# Patient Record
Sex: Female | Born: 2000 | Race: White | Hispanic: Yes | Marital: Single | Smoking: Never smoker
Health system: Southern US, Community
[De-identification: ages and names within clinical notes are randomized; demographics above are authoritative.]

---

## 2019-10-17 ENCOUNTER — Other Ambulatory Visit: Payer: Self-pay

## 2019-10-17 ENCOUNTER — Encounter (HOSPITAL_COMMUNITY): Payer: Self-pay

## 2019-10-17 ENCOUNTER — Inpatient Hospital Stay (HOSPITAL_COMMUNITY)
Admission: AD | Admit: 2019-10-17 | Discharge: 2019-10-18 | Disposition: A | Discharge status: Home / Self Care | Payer: Self-pay | Attending: Obstetrics & Gynecology | Admitting: Obstetrics & Gynecology

## 2019-10-17 DIAGNOSIS — O26899 Other specified pregnancy related conditions, unspecified trimester: Secondary | ICD-10-CM

## 2019-10-17 DIAGNOSIS — K2970 Gastritis, unspecified, without bleeding: Secondary | ICD-10-CM

## 2019-10-17 DIAGNOSIS — R102 Pelvic and perineal pain: Secondary | ICD-10-CM | POA: Insufficient documentation

## 2019-10-17 DIAGNOSIS — O99111 Other diseases of the blood and blood-forming organs and certain disorders involving the immune mechanism complicating pregnancy, first trimester: Secondary | ICD-10-CM | POA: Insufficient documentation

## 2019-10-17 DIAGNOSIS — R1012 Left upper quadrant pain: Secondary | ICD-10-CM | POA: Insufficient documentation

## 2019-10-17 DIAGNOSIS — O211 Hyperemesis gravidarum with metabolic disturbance: Secondary | ICD-10-CM | POA: Insufficient documentation

## 2019-10-17 DIAGNOSIS — R112 Nausea with vomiting, unspecified: Secondary | ICD-10-CM

## 2019-10-17 DIAGNOSIS — D72829 Elevated white blood cell count, unspecified: Secondary | ICD-10-CM | POA: Insufficient documentation

## 2019-10-17 DIAGNOSIS — Z3A01 Less than 8 weeks gestation of pregnancy: Secondary | ICD-10-CM | POA: Insufficient documentation

## 2019-10-17 DIAGNOSIS — E876 Hypokalemia: Secondary | ICD-10-CM

## 2019-10-17 DIAGNOSIS — O26891 Other specified pregnancy related conditions, first trimester: Secondary | ICD-10-CM | POA: Insufficient documentation

## 2019-10-17 DIAGNOSIS — O3680X0 Pregnancy with inconclusive fetal viability, not applicable or unspecified: Secondary | ICD-10-CM

## 2019-10-17 LAB — CBC
HCT: 46.5 % — ABNORMAL HIGH (ref 36.0–46.0)
Hemoglobin: 15.6 g/dL — ABNORMAL HIGH (ref 12.0–15.0)
MCH: 26.7 pg (ref 26.0–34.0)
MCHC: 33.5 g/dL (ref 30.0–36.0)
MCV: 79.5 fL — ABNORMAL LOW (ref 80.0–100.0)
Platelets: 369 10*3/uL (ref 150–400)
RBC: 5.85 MIL/uL — ABNORMAL HIGH (ref 3.87–5.11)
RDW: 14.3 % (ref 11.5–15.5)
WBC: 16.5 10*3/uL — ABNORMAL HIGH (ref 4.0–10.5)
nRBC: 0 % (ref 0.0–0.2)

## 2019-10-17 LAB — I-STAT BETA HCG BLOOD, ED (MC, WL, AP ONLY): I-stat hCG, quantitative: >2000 m[IU]/mL — ABNORMAL HIGH (ref <5)

## 2019-10-17 LAB — URINALYSIS, ROUTINE W REFLEX MICROSCOPIC
Bilirubin Urine: NEGATIVE
Glucose, UA: NEGATIVE mg/dL
Ketones, ur: 80 mg/dL — AB
Nitrite: NEGATIVE
Protein, ur: NEGATIVE mg/dL
Specific Gravity, Urine: 1.018 (ref 1.005–1.030)
pH: 5 (ref 5.0–8.0)

## 2019-10-17 LAB — COMPREHENSIVE METABOLIC PANEL
ALT: 11 U/L (ref 0–44)
AST: 15 U/L (ref 15–41)
Albumin: 4.4 g/dL (ref 3.5–5.0)
Alkaline Phosphatase: 89 U/L (ref 38–126)
Anion gap: 17 — ABNORMAL HIGH (ref 5–15)
BUN: 19 mg/dL (ref 6–20)
CO2: 28 mmol/L (ref 22–32)
Calcium: 9.2 mg/dL (ref 8.9–10.3)
Chloride: 89 mmol/L — ABNORMAL LOW (ref 98–111)
Creatinine, Ser: 0.76 mg/dL (ref 0.44–1.00)
GFR calc Af Amer: >60 mL/min (ref >60)
GFR calc non Af Amer: >60 mL/min (ref >60)
Glucose, Bld: 96 mg/dL (ref 70–99)
Potassium: 2.8 mmol/L — ABNORMAL LOW (ref 3.5–5.1)
Sodium: 134 mmol/L — ABNORMAL LOW (ref 135–145)
Total Bilirubin: 1.3 mg/dL — ABNORMAL HIGH (ref 0.3–1.2)
Total Protein: 8.2 g/dL — ABNORMAL HIGH (ref 6.5–8.1)

## 2019-10-17 LAB — WET PREP, GENITAL
Sperm: NONE SEEN
Trich, Wet Prep: NONE SEEN
Yeast Wet Prep HPF POC: NONE SEEN

## 2019-10-17 LAB — LIPASE, BLOOD: Lipase: 22 U/L (ref 11–51)

## 2019-10-17 LAB — ABO/RH: ABO/RH(D): O POS

## 2019-10-17 MED ORDER — SODIUM CHLORIDE 0.9% FLUSH
3.0000 mL | Freq: Once | INTRAVENOUS | Status: AC
  Administered 2019-10-17: 3 mL via INTRAVENOUS

## 2019-10-17 MED ORDER — ONDANSETRON 4 MG PO TBDP
4.0000 mg | ORAL_TABLET | Freq: Once | ORAL | Status: AC | PRN
  Administered 2019-10-17: 4 mg via ORAL
  Filled 2019-10-17: qty 1

## 2019-10-17 MED ORDER — SODIUM CHLORIDE 0.9 % IV SOLN
Freq: Once | INTRAVENOUS | Status: AC
  Administered 2019-10-18: via INTRAVENOUS

## 2019-10-17 MED ORDER — POTASSIUM CHLORIDE 10 MEQ/100ML IV SOLN
10.0000 meq | INTRAVENOUS | Status: DC
  Administered 2019-10-18: 10 meq via INTRAVENOUS
  Filled 2019-10-17 (×3): qty 100

## 2019-10-17 NOTE — ED Triage Notes (Signed)
Pt reports generalized abd pain and emesis since Saturday. Pt vomiting in triage.

## 2019-10-17 NOTE — MAU Note (Signed)
Pt reports to MAU from the ED for abd pain and vomiting, also just found out she is pregnant at the ED.

## 2019-10-17 NOTE — MAU Provider Note (Signed)
Chief Complaint: Abdominal Pain and Emesis   First Provider Initiated Contact with Patient 10/17/19 2302        SUBJECTIVE HPI: Lori Daugherty is a 18 y.o. G1P0 at [redacted]w[redacted]d by LMP who presents to maternity admissions reporting vomiting since Saturday (4 days).  Also has Left upper quadrant pain since then.  Has had this happen before, a few months ago in [place]. States had a full work up including CT scan and "they never found anything".  States had it happen one other time with no findings.  Has not tried anything for it.  Did not know she was pregnant.  Was not using contraception.. She denies vaginal bleeding, vaginal itching/burning, urinary symptoms, h/a, dizziness, or fever/chills.    Abdominal Pain This is a new problem. The current episode started in the past 7 days. The problem occurs constantly. The problem has been unchanged. The pain is located in the LUQ. The pain is moderate. The quality of the pain is dull. The abdominal pain does not radiate. Associated symptoms include nausea and vomiting. Pertinent negatives include no constipation, diarrhea, dysuria, fever, frequency, headaches or myalgias. Nothing aggravates the pain. The pain is relieved by nothing. She has tried nothing for the symptoms. There is no history of abdominal surgery.  Emesis  This is a new problem. The current episode started in the past 7 days. The problem has been unchanged. There has been no fever. Associated symptoms include abdominal pain. Pertinent negatives include no chest pain, chills, coughing, diarrhea, dizziness, fever, headaches or myalgias. She has tried nothing for the symptoms.   RN Note: Pt reports to MAU from the ED for abd pain and vomiting, also just found out she is pregnant at the ED.  History reviewed. No pertinent past medical history. History reviewed. No pertinent surgical history. Social History   Socioeconomic History  . Marital status: Single    Spouse name: Not on file  . Number  of children: Not on file  . Years of education: Not on file  . Highest education level: Not on file  Occupational History  . Not on file  Social Needs  . Financial resource strain: Not on file  . Food insecurity    Worry: Not on file    Inability: Not on file  . Transportation needs    Medical: Not on file    Non-medical: Not on file  Tobacco Use  . Smoking status: Never Smoker  Substance and Sexual Activity  . Alcohol use: Never    Frequency: Never  . Drug use: Never  . Sexual activity: Not on file  Lifestyle  . Physical activity    Days per week: Not on file    Minutes per session: Not on file  . Stress: Not on file  Relationships  . Social Herbalist on phone: Not on file    Gets together: Not on file    Attends religious service: Not on file    Active member of club or organization: Not on file    Attends meetings of clubs or organizations: Not on file    Relationship status: Not on file  . Intimate partner violence    Fear of current or ex partner: Not on file    Emotionally abused: Not on file    Physically abused: Not on file    Forced sexual activity: Not on file  Other Topics Concern  . Not on file  Social History Narrative  . Not on file  No current facility-administered medications on file prior to encounter.    No current outpatient medications on file prior to encounter.   No Known Allergies  I have reviewed patient's Past Medical Hx, Surgical Hx, Family Hx, Social Hx, medications and allergies.   ROS:  Review of Systems  Constitutional: Negative for chills and fever.  Respiratory: Negative for cough.   Cardiovascular: Negative for chest pain.  Gastrointestinal: Positive for abdominal pain, nausea and vomiting. Negative for constipation and diarrhea.  Genitourinary: Negative for dysuria and frequency.  Musculoskeletal: Negative for myalgias.  Neurological: Negative for dizziness and headaches.   Review of Systems  Other systems  negative   Physical Exam  Physical Exam Patient Vitals for the past 24 hrs:  BP Temp Temp src Pulse Resp SpO2 Height Weight  10/17/19 2222 119/83 - - 80 - - - -  10/17/19 2012 127/74 98 F (36.7 C) Oral 73 18 100 %  (1.549 m) 59 kg   Constitutional: Well-developed, well-nourished female in no acute distress.  Cardiovascular: normal rate Respiratory: normal effort GI: Abd soft, non-tender. States LUQ is tender to palpation but does not react with exam.  Pos BS x 4 MS: Extremities nontender, no edema, normal ROM Neurologic: Alert and oriented x 4.  GU: Neg CVAT bilaterally  PELVIC EXAM: Cervix pink, visually closed, without lesion, scant white creamy discharge, vaginal walls and external genitalia normal Bimanual exam: Cervix 0/long/high, firm, anterior, neg CMT, uterus nontender, nonenlarged, adnexa without tenderness, enlargement, or mass   LAB RESULTS Results for orders placed or performed during the hospital encounter of 10/17/19 (from the past 24 hour(s))  Urinalysis, Routine w reflex microscopic     Status: Abnormal   Collection Time: 10/17/19  8:16 PM  Result Value Ref Range   Color, Urine YELLOW YELLOW   APPearance HAZY (A) CLEAR   Specific Gravity, Urine 1.018 1.005 - 1.030   pH 5.0 5.0 - 8.0   Glucose, UA NEGATIVE NEGATIVE mg/dL   Hgb urine dipstick SMALL (A) NEGATIVE   Bilirubin Urine NEGATIVE NEGATIVE   Ketones, ur 80 (A) NEGATIVE mg/dL   Protein, ur NEGATIVE NEGATIVE mg/dL   Nitrite NEGATIVE NEGATIVE   Leukocytes,Ua MODERATE (A) NEGATIVE   RBC / HPF 0-5 0 - 5 RBC/hpf   WBC, UA 21-50 0 - 5 WBC/hpf   Bacteria, UA FEW (A) NONE SEEN   Squamous Epithelial / LPF 0-5 0 - 5   Mucus PRESENT   Lipase, blood     Status: None   Collection Time: 10/17/19  8:23 PM  Result Value Ref Range   Lipase 22 11 - 51 U/L  Comprehensive metabolic panel     Status: Abnormal   Collection Time: 10/17/19  8:23 PM  Result Value Ref Range   Sodium 134 (L) 135 - 145 mmol/L    Potassium 2.8 (L) 3.5 - 5.1 mmol/L   Chloride 89 (L) 98 - 111 mmol/L   CO2 28 22 - 32 mmol/L   Glucose, Bld 96 70 - 99 mg/dL   BUN 19 6 - 20 mg/dL   Creatinine, Ser 1.61 0.44 - 1.00 mg/dL   Calcium 9.2 8.9 - 09.6 mg/dL   Total Protein 8.2 (H) 6.5 - 8.1 g/dL   Albumin 4.4 3.5 - 5.0 g/dL   AST 15 15 - 41 U/L   ALT 11 0 - 44 U/L   Alkaline Phosphatase 89 38 - 126 U/L   Total Bilirubin 1.3 (H) 0.3 - 1.2 mg/dL   GFR  calc non Af Amer >60 >60 mL/min   GFR calc Af Amer >60 >60 mL/min   Anion gap 17 (H) 5 - 15  CBC     Status: Abnormal   Collection Time: 10/17/19  8:23 PM  Result Value Ref Range   WBC 16.5 (H) 4.0 - 10.5 K/uL   RBC 5.85 (H) 3.87 - 5.11 MIL/uL   Hemoglobin 15.6 (H) 12.0 - 15.0 g/dL   HCT 36.1 (H) 44.3 - 15.4 %   MCV 79.5 (L) 80.0 - 100.0 fL   MCH 26.7 26.0 - 34.0 pg   MCHC 33.5 30.0 - 36.0 g/dL   RDW 00.8 67.6 - 19.5 %   Platelets 369 150 - 400 K/uL   nRBC 0.0 0.0 - 0.2 %  I-Stat beta hCG blood, ED     Status: Abnormal   Collection Time: 10/17/19  8:57 PM  Result Value Ref Range   I-stat hCG, quantitative >2,000.0 (H) <5 mIU/mL   Comment 3          Wet prep, genital     Status: Abnormal   Collection Time: 10/17/19 11:08 PM  Result Value Ref Range   Yeast Wet Prep HPF POC NONE SEEN NONE SEEN   Trich, Wet Prep NONE SEEN NONE SEEN   Clue Cells Wet Prep HPF POC PRESENT (A) NONE SEEN   WBC, Wet Prep HPF POC MANY (A) NONE SEEN   Sperm NONE SEEN   hCG quantitative     Status: Abnormal   Collection Time: 10/17/19 11:17 PM  Result Value Ref Range   hCG, Beta Chain, Quant, S 6,026 (H) <5 mIU/mL  ABO/Rh     Status: None   Collection Time: 10/17/19 11:17 PM  Result Value Ref Range   ABO/RH(D) O POS    No rh immune globuloin      NOT A RH IMMUNE GLOBULIN CANDIDATE, PT RH POSITIVE Performed at [facility], [address], [place], [place] [address]   HIV Antibody (routine testing w rflx)     Status: None   Collection Time: 10/17/19 11:17 PM  Result Value Ref  Range   HIV Screen 4th Generation wRfx NON REACTIVE NON REACTIVE    IMAGING US Abdomen Complete  Result Date: 10/18/2019 CLINICAL DATA:  Pelvic pain and left upper quadrant pain EXAM: ABDOMEN ULTRASOUND COMPLETE COMPARISON:  None. FINDINGS: Gallbladder: No gallstones or wall thickening visualized. No sonographic Murphy sign noted by sonographer. Common bile duct: Diameter: 3.6 mm Liver: No focal lesion identified. Within normal limits in parenchymal echogenicity. Portal vein is patent on color Doppler imaging with normal direction of blood flow towards the liver. IVC: No abnormality visualized. Pancreas: Visualized portion unremarkable. Spleen: Size and appearance within normal limits. Right Kidney: Length: 9.7. Echogenicity within normal limits. No mass or hydronephrosis visualized. Left Kidney: Length: 10.4. Echogenicity within normal limits. No mass or hydronephrosis visualized. Abdominal aorta: No aneurysm visualized. Other findings: None. IMPRESSION: Normal abdominal ultrasound Electronically Signed   By: Jonna Clark M.D.   On: 10/18/2019 01:08   US Ob Less Than 14 Weeks With Ob Transvaginal  Result Date: 10/18/2019 CLINICAL DATA:  Positive pregnancy test, pelvic pain EXAM: OBSTETRIC <14 WK [place] AND TRANSVAGINAL OB US TECHNIQUE: Both transabdominal and transvaginal ultrasound examinations were performed for complete evaluation of the gestation as well as the maternal uterus, adnexal regions, and pelvic cul-de-sac. Transvaginal technique was performed to assess early pregnancy. COMPARISON:  None. FINDINGS: Intrauterine gestational sac: Single Yolk sac:  Visualized. Embryo:  Not Visualized. Cardiac Activity: Not Visualized. MSD: 6.16 mm   5 w   2 d Subchorionic hemorrhage:  None visualized. Maternal uterus/adnexae: Normal appearing ovaries. IMPRESSION: Probable early intrauterine gestational sac and yolk sac, but no fetal pole, or cardiac activity yet visualized. Recommend follow-up quantitative B-HCG  levels and follow-up US in 14 days to assess viability. This recommendation follows SRU consensus guidelines: Diagnostic Criteria for Nonviable Pregnancy Early in the First Trimester. Malva Limes Engl J Med 2013; 161:0960-45; 369:1443-51. Electronically Signed   By: Jonna ClarkBindu  Avutu M.D.   On: 10/18/2019 01:10     MAU Management/MDM: Ordered usual first trimester r/o ectopic labs.   Pelvic exam and cultures done Will check baseline Ultrasound to rule out ectopic.  >> US rules out ectopic pregnancy, GS with YS seen  This bleeding/pain can represent a normal pregnancy with bleeding, spontaneous abortion or even an ectopic which can be life-threatening.  The process as listed above helps to determine which of these is present.   Abdominal US is negative.  IV potassium runs ordered.  Tried to give PO potassium but she vomited it, so continued with IV GI cocktail given which was tolerated.   This did help the LUQ pain Zofran was given in main ED.  Has not vomited while here except with K-Dur.  States is nauseated Since her WBC count is so high and US was negative, will treat presumptively with Rocephin for Pyelo (leukocytes and Hgb) Urine sent for culture Once Potassium supps are infused will try to discharge home Suspect Nausea / vomiting may be pregnancy related  LUQ pain likely gastric  ASSESSMENT Pregnancy at 5520w5d by LMP, 49105w2d by US (GS and YS only) LUQ pain, likely gastritis Leukocytosis and leukocytes in urine, possible UTI Nausea and vomiting Pelvic pain, ectopic pregnancy ruled out Hypokalemia, partially replaced.  PLAN Discharge home Rx Phenergan for nausea  Rx Protonix for acid reflux/gastritis Urine to culture Pt stable at time of discharge. Encouraged to return here or to other Urgent Care/ED if she develops worsening of symptoms, increase in pain, fever, or other concerning symptoms.    Wynelle BourgeoisMarie Deyvi Bonanno CNM, MSN Certified Nurse-Midwife 10/17/2019  11:02 PM

## 2019-10-18 ENCOUNTER — Inpatient Hospital Stay (HOSPITAL_COMMUNITY): Payer: Self-pay

## 2019-10-18 LAB — HEPATITIS A ANTIBODY, TOTAL: hep A Total Ab: REACTIVE — AB

## 2019-10-18 LAB — HCG, QUANTITATIVE, PREGNANCY: hCG, Beta Chain, Quant, S: 6026 m[IU]/mL — ABNORMAL HIGH (ref <5)

## 2019-10-18 LAB — HIV ANTIBODY (ROUTINE TESTING W REFLEX): HIV Screen 4th Generation wRfx: NONREACTIVE

## 2019-10-18 MED ORDER — PANTOPRAZOLE SODIUM 20 MG PO TBEC: 20.0000 mg | DELAYED_RELEASE_TABLET | Freq: Every day | ORAL | 0 refills | Status: AC

## 2019-10-18 MED ORDER — PROMETHAZINE HCL 25 MG/ML IJ SOLN
12.5000 mg | Freq: Once | INTRAMUSCULAR | Status: AC
  Administered 2019-10-18: 12.5 mg via INTRAVENOUS
  Filled 2019-10-18: qty 1

## 2019-10-18 MED ORDER — POTASSIUM CHLORIDE CRYS ER 20 MEQ PO TBCR: 40.0000 meq | EXTENDED_RELEASE_TABLET | Freq: Once | ORAL | Status: DC

## 2019-10-18 MED ORDER — POTASSIUM CHLORIDE 10 MEQ/100ML IV SOLN
10.0000 meq | Freq: Once | INTRAVENOUS | Status: AC
  Administered 2019-10-18: 10 meq via INTRAVENOUS
  Filled 2019-10-18 (×2): qty 100

## 2019-10-18 MED ORDER — PROMETHAZINE HCL 25 MG PO TABS: 25.0000 mg | ORAL_TABLET | Freq: Four times a day (QID) | ORAL | 2 refills | Status: AC | PRN

## 2019-10-18 MED ORDER — ALUM & MAG HYDROXIDE-SIMETH 200-200-20 MG/5ML PO SUSP
30.0000 mL | Freq: Once | ORAL | Status: AC
  Administered 2019-10-18: 30 mL via ORAL
  Filled 2019-10-18: qty 30

## 2019-10-18 MED ORDER — POTASSIUM CHLORIDE 10 MEQ/100ML IV SOLN
10.0000 meq | Freq: Once | INTRAVENOUS | Status: AC
  Filled 2019-10-18: qty 100

## 2019-10-18 MED ORDER — LACTATED RINGERS IV SOLN: Freq: Once | INTRAVENOUS | Status: DC

## 2019-10-18 MED ORDER — POTASSIUM CHLORIDE CRYS ER 20 MEQ PO TBCR
40.0000 meq | EXTENDED_RELEASE_TABLET | Freq: Once | ORAL | Status: AC
  Administered 2019-10-18: 40 meq via ORAL
  Filled 2019-10-18: qty 2

## 2019-10-18 MED ORDER — SODIUM CHLORIDE 0.9 % IV SOLN
INTRAVENOUS | Status: DC
  Administered 2019-10-18: 04:00:00 via INTRAVENOUS

## 2019-10-18 MED ORDER — LIDOCAINE VISCOUS HCL 2 % MT SOLN
15.0000 mL | Freq: Once | OROMUCOSAL | Status: AC
  Administered 2019-10-18: 15 mL via ORAL
  Filled 2019-10-18: qty 15

## 2019-10-18 MED ORDER — SODIUM CHLORIDE 0.9 % IV SOLN
1.0000 g | Freq: Once | INTRAVENOUS | Status: AC
  Administered 2019-10-18: 1 g via INTRAVENOUS
  Filled 2019-10-18: qty 1

## 2019-10-18 NOTE — MAU Note (Signed)
Pt has not thrown up since she had the GI medication. She also reports that the burning sensation she had in her upper left epigastric area has improved.Marilynne Drivers, RN

## 2019-10-18 NOTE — Discharge Instructions (Signed)
First Trimester of Pregnancy °The first trimester of pregnancy is from week 1 until the end of week 13 (months 1 through 3). A week after a sperm fertilizes an egg, the egg will implant on the wall of the uterus. This embryo will begin to develop into a baby. Genes from you and your partner will form the baby. The female genes will determine whether the baby will be a boy or a girl. At 6-8 weeks, the eyes and face will be formed, and the heartbeat can be seen on ultrasound. At the end of 12 weeks, all the baby's organs will be formed. °Now that you are pregnant, you will want to do everything you can to have a healthy baby. Two of the most important things are to get good prenatal care and to follow your health care provider's instructions. Prenatal care is all the medical care you receive before the baby's birth. This care will help prevent, find, and treat any problems during the pregnancy and childbirth. °Body changes during your first trimester °Your body goes through many changes during pregnancy. The changes vary from woman to woman. °· You may gain or lose a couple of pounds at first. °· You may feel sick to your stomach (nauseous) and you may throw up (vomit). If the vomiting is uncontrollable, call your health care provider. °· You may tire easily. °· You may develop headaches that can be relieved by medicines. All medicines should be approved by your health care provider. °· You may urinate more often. Painful urination may mean you have a bladder infection. °· You may develop heartburn as a result of your pregnancy. °· You may develop constipation because certain hormones are causing the muscles that push stool through your intestines to slow down. °· You may develop hemorrhoids or swollen veins (varicose veins). °· Your breasts may begin to grow larger and become tender. Your nipples may stick out more, and the tissue that surrounds them (areola) may become darker. °· Your gums may bleed and may be  sensitive to brushing and flossing. °· Dark spots or blotches (chloasma, mask of pregnancy) may develop on your face. This will likely fade after the baby is born. °· Your menstrual periods will stop. °· You may have a loss of appetite. °· You may develop cravings for certain kinds of food. °· You may have changes in your emotions from day to day, such as being excited to be pregnant or being concerned that something may go wrong with the pregnancy and baby. °· You may have more vivid and strange dreams. °· You may have changes in your hair. These can include thickening of your hair, rapid growth, and changes in texture. Some women also have hair loss during or after pregnancy, or hair that feels dry or thin. Your hair will most likely return to normal after your baby is born. °What to expect at prenatal visits °During a routine prenatal visit: °· You will be weighed to make sure you and the baby are growing normally. °· Your blood pressure will be taken. °· Your abdomen will be measured to track your baby's growth. °· The fetal heartbeat will be listened to between weeks 10 and 14 of your pregnancy. °· Test results from any previous visits will be discussed. °Your health care provider may ask you: °· How you are feeling. °· If you are feeling the baby move. °· If you have had any abnormal symptoms, such as leaking fluid, bleeding, severe headaches, or abdominal   cramping. °· If you are using any tobacco products, including cigarettes, chewing tobacco, and electronic cigarettes. °· If you have any questions. °Other tests that may be performed during your first trimester include: °· Blood tests to find your blood type and to check for the presence of any previous infections. The tests will also be used to check for low iron levels (anemia) and protein on red blood cells (Rh antibodies). Depending on your risk factors, or if you previously had diabetes during pregnancy, you may have tests to check for high blood sugar  that affects pregnant women (gestational diabetes). °· Urine tests to check for infections, diabetes, or protein in the urine. °· An ultrasound to confirm the proper growth and development of the baby. °· Fetal screens for spinal cord problems (spina bifida) and Down syndrome. °· HIV (human immunodeficiency virus) testing. Routine prenatal testing includes screening for HIV, unless you choose not to have this test. °· You may need other tests to make sure you and the baby are doing well. °Follow these instructions at home: °Medicines °· Follow your health care provider's instructions regarding medicine use. Specific medicines may be either safe or unsafe to take during pregnancy. °· Take a prenatal vitamin that contains at least 600 micrograms (mcg) of folic acid. °· If you develop constipation, try taking a stool softener if your health care provider approves. °Eating and drinking ° °· Eat a balanced diet that includes fresh fruits and vegetables, whole grains, good sources of protein such as meat, eggs, or tofu, and low-fat dairy. Your health care provider will help you determine the amount of weight gain that is right for you. °· Avoid raw meat and uncooked cheese. These carry germs that can cause birth defects in the baby. °· Eating four or five small meals rather than three large meals a day may help relieve nausea and vomiting. If you start to feel nauseous, eating a few soda crackers can be helpful. Drinking liquids between meals, instead of during meals, also seems to help ease nausea and vomiting. °· Limit foods that are high in fat and processed sugars, such as fried and sweet foods. °· To prevent constipation: °? Eat foods that are high in fiber, such as fresh fruits and vegetables, whole grains, and beans. °? Drink enough fluid to keep your urine clear or pale yellow. °Activity °· Exercise only as directed by your health care provider. Most women can continue their usual exercise routine during  pregnancy. Try to exercise for 30 minutes at least 5 days a week. Exercising will help you: °? Control your weight. °? Stay in shape. °? Be prepared for labor and delivery. °· Experiencing pain or cramping in the lower abdomen or lower back is a good sign that you should stop exercising. Check with your health care provider before continuing with normal exercises. °· Try to avoid standing for long periods of time. Move your legs often if you must stand in one place for a long time. °· Avoid heavy lifting. °· Wear low-heeled shoes and practice good posture. °· You may continue to have sex unless your health care provider tells you not to. °Relieving pain and discomfort °· Wear a good support bra to relieve breast tenderness. °· Take warm sitz baths to soothe any pain or discomfort caused by hemorrhoids. Use hemorrhoid cream if your health care provider approves. °· Rest with your legs elevated if you have leg cramps or low back pain. °· If you develop varicose veins in   your legs, wear support hose. Elevate your feet for 15 minutes, 3-4 times a day. Limit salt in your diet. Prenatal care  Schedule your prenatal visits by the twelfth week of pregnancy. They are usually scheduled monthly at first, then more often in the last 2 months before delivery.  Write down your questions. Take them to your prenatal visits.  Keep all your prenatal visits as told by your health care provider. This is important. Safety  Wear your seat belt at all times when driving.  Make a list of emergency phone numbers, including numbers for family, friends, the hospital, and police and fire departments. General instructions  Ask your health care provider for a referral to a local prenatal education class. Begin classes no later than the beginning of month 6 of your pregnancy.  Ask for help if you have counseling or nutritional needs during pregnancy. Your health care provider can offer advice or refer you to specialists for help  with various needs.  Do not use hot tubs, steam rooms, or saunas.  Do not douche or use tampons or scented sanitary pads.  Do not cross your legs for long periods of time.  Avoid cat litter boxes and soil used by cats. These carry germs that can cause birth defects in the baby and possibly loss of the fetus by miscarriage or stillbirth.  Avoid all smoking, herbs, alcohol, and medicines not prescribed by your health care provider. Chemicals in these products affect the formation and growth of the baby.  Do not use any products that contain nicotine or tobacco, such as cigarettes and e-cigarettes. If you need help quitting, ask your health care provider. You may receive counseling support and other resources to help you quit.  Schedule a dentist appointment. At home, brush your teeth with a soft toothbrush and be gentle when you floss. Contact a health care provider if:  You have dizziness.  You have mild pelvic cramps, pelvic pressure, or nagging pain in the abdominal area.  You have persistent nausea, vomiting, or diarrhea.  You have a bad smelling vaginal discharge.  You have pain when you urinate.  You notice increased swelling in your face, hands, legs, or ankles.  You are exposed to fifth disease or chickenpox.  You are exposed to [place] measles (rubella) and have never had it. Get help right away if:  You have a fever.  You are leaking fluid from your vagina.  You have spotting or bleeding from your vagina.  You have severe abdominal cramping or pain.  You have rapid weight gain or loss.  You vomit blood or material that looks like coffee grounds.  You develop a severe headache.  You have shortness of breath.  You have any kind of trauma, such as from a fall or a car accident. Summary  The first trimester of pregnancy is from week 1 until the end of week 13 (months 1 through 3).  Your body goes through many changes during pregnancy. The changes vary from  woman to woman.  You will have routine prenatal visits. During those visits, your health care provider will examine you, discuss any test results you may have, and talk with you about how you are feeling. This information is not intended to replace advice given to you by your health care provider. Make sure you discuss any questions you have with your health care provider. Document Released: 11/16/2001 Document Revised: 11/04/2017 Document Reviewed: 11/03/2016 Elsevier Patient Education  [employer]. Morning Sickness  Morning sickness is when a woman feels nauseous during pregnancy. This nauseous feeling may or may not come with vomiting. It often occurs in the morning, but it can be a problem at any time of day. Morning sickness is most common during the first trimester. In some cases, it may continue throughout pregnancy. Although morning sickness is unpleasant, it is usually harmless unless the woman develops severe and continual vomiting (hyperemesis gravidarum), a condition that requires more intense treatment. What are the causes? The exact cause of this condition is not known, but it seems to be related to normal hormonal changes that occur in pregnancy. What increases the risk? You are more likely to develop this condition if:  You experienced nausea or vomiting before your pregnancy.  You had morning sickness during a previous pregnancy.  You are pregnant with more than one baby, such as twins. What are the signs or symptoms? Symptoms of this condition include:  Nausea.  Vomiting. How is this diagnosed? This condition is usually diagnosed based on your signs and symptoms. How is this treated? In many cases, treatment is not needed for this condition. Making some changes to what you eat may help to control symptoms. Your health care provider may also prescribe or recommend:  Vitamin B6 supplements.  Anti-nausea medicines.  Ginger. Follow these instructions at  home: Medicines  Take over-the-counter and prescription medicines only as told by your health care provider. Do not use any prescription, over-the-counter, or herbal medicines for morning sickness without first talking with your health care provider.  Taking multivitamins before getting pregnant can prevent or decrease the severity of morning sickness in most women. Eating and drinking  Eat a piece of dry toast or crackers before getting out of bed in the morning.  Eat 5 or 6 small meals a day.  Eat dry and bland foods, such as rice or a baked potato. Foods that are high in carbohydrates are often helpful.  Avoid greasy, fatty, and spicy foods.  Have someone cook for you if the smell of any food causes nausea and vomiting.  If you feel nauseous after taking prenatal vitamins, take the vitamins at night or with a snack.  Snack on protein foods between meals if you are hungry. Nuts, yogurt, and cheese are good options.  Drink fluids throughout the day.  Try ginger ale made with real ginger, ginger tea made from fresh grated ginger, or ginger candies. General instructions  Do not use any products that contain nicotine or tobacco, such as cigarettes and e-cigarettes. If you need help quitting, ask your health care provider.  Get an air purifier to keep the air in your house free of odors.  Get plenty of fresh air.  Try to avoid odors that trigger your nausea.  Consider trying these methods to help relieve symptoms: ? Wearing an acupressure wristband. These wristbands are often worn for seasickness. ? Acupuncture. Contact a health care provider if:  Your home remedies are not working and you need medicine.  You feel dizzy or light-headed.  You are losing weight. Get help right away if:  You have persistent and uncontrolled nausea and vomiting.  You faint.  You have severe pain in your abdomen. Summary  Morning sickness is when a woman feels nauseous during pregnancy.  This nauseous feeling may or may not come with vomiting.  Morning sickness is most common during the first trimester.  It often occurs in the morning, but it can be a problem at any time  of day.  In many cases, treatment is not needed for this condition. Making some changes to what you eat may help to control symptoms. This information is not intended to replace advice given to you by your health care provider. Make sure you discuss any questions you have with your health care provider. Document Released: 01/13/2007 Document Revised: 11/04/2017 Document Reviewed: 12/25/2016 Elsevier Patient Education  2020 [employer] Gastritis, Adult Gastritis is inflammation of the stomach. There are two kinds of gastritis:  Acute gastritis. This kind develops suddenly.  Chronic gastritis. This kind is much more common and lasts for a long time. Gastritis happens when the lining of the stomach becomes weak or gets damaged. Without treatment, gastritis can lead to stomach bleeding and ulcers. What are the causes? This condition may be caused by:  An infection.  Drinking too much alcohol.  Certain medicines. These include steroids, antibiotics, and some over-the-counter medicines, such as aspirin or ibuprofen.  Having too much acid in the stomach.  A disease of the intestines or stomach.  Stress.  An allergic reaction.  Crohn's disease.  Some cancer treatments (radiation). Sometimes the cause of this condition is not known. What are the signs or symptoms? Symptoms of this condition include:  Pain or a burning sensation in the upper abdomen.  Nausea.  Vomiting.  An uncomfortable feeling of fullness after eating.  Weight loss.  Bad breath.  Blood in your vomit or stools. In some cases, there are no symptoms. How is this diagnosed? This condition may be diagnosed with:  Your medical history and a description of your symptoms.  A physical exam.  Tests. These can  include: ? Blood tests. ? Stool tests. ? A test in which a thin, flexible instrument with a light and a camera is passed down the esophagus and into the stomach (upper endoscopy). ? A test in which a sample of tissue is taken for testing (biopsy). How is this treated? This condition may be treated with medicines. The medicines that are used vary depending on the cause of the gastritis:  If the condition is caused by a bacterial infection, you may be given antibiotic medicines.  If the condition is caused by too much acid in the stomach, you may be given medicines called H2 blockers, proton pump inhibitors, or antacids. Treatment may also involve stopping the use of certain medicines, such as aspirin, ibuprofen, or other NSAIDs. Follow these instructions at home: Medicines  Take over-the-counter and prescription medicines only as told by your health care provider.  If you were prescribed an antibiotic medicine, take it as told by your health care provider. Do not stop taking the antibiotic even if you start to feel better. Eating and drinking   Eat small, frequent meals instead of large meals.  Avoid foods and drinks that make your symptoms worse.  Drink enough fluid to keep your urine pale yellow. Alcohol use  Do not drink alcohol if: ? Your health care provider tells you not to drink. ? You are pregnant, may be pregnant, or are planning to become pregnant.  If you drink alcohol: ? Limit your use to:  0-1 drink a day for women.  0-2 drinks a day for men. ? Be aware of how much alcohol is in your drink. In the [place] one drink equals one 12 oz bottle of beer (355 mL), one 5 oz glass of wine (148 mL), or one 1 oz glass of hard liquor (44 mL). General instructions  Talk with  your health care provider about ways to manage stress, such as getting regular exercise or practicing deep breathing, meditation, or yoga.  Do not use any products that contain nicotine or tobacco, such as  cigarettes and e-cigarettes. If you need help quitting, ask your health care provider.  Keep all follow-up visits as told by your health care provider. This is important. Contact a health care provider if:  Your symptoms get worse.  Your symptoms return after treatment. Get help right away if:  You vomit blood or material that looks like coffee grounds.  You have black or dark red stools.  You are unable to keep fluids down.  Your abdominal pain gets worse.  You have a fever.  You do not feel better after one week. Summary  Gastritis is inflammation of the lining of the stomach that can occur suddenly (acute) or develop slowly over time (chronic).  This condition is diagnosed with a medical history, a physical exam, or tests.  This condition may be treated with medicines to treat infection or medicines to reduce the amount of acid in your stomach.  Follow your health care provider's instructions about taking medicines, making changes to your diet, and knowing when to call for help. This information is not intended to replace advice given to you by your health care provider. Make sure you discuss any questions you have with your health care provider. Document Released: 11/16/2001 Document Revised: 04/11/2018 Document Reviewed: 04/11/2018 Elsevier Patient Education  2020 [employer].

## 2019-10-18 NOTE — MAU Note (Signed)
Pt was not able to keep down the PO potassium and vomited it up almost immediately. Lori Daugherty notified.

## 2019-10-19 ENCOUNTER — Encounter: Payer: Self-pay | Admitting: Advanced Practice Midwife

## 2019-10-19 DIAGNOSIS — B159 Hepatitis A without hepatic coma: Secondary | ICD-10-CM | POA: Insufficient documentation

## 2019-10-19 DIAGNOSIS — R109 Unspecified abdominal pain: Secondary | ICD-10-CM | POA: Insufficient documentation

## 2019-10-19 DIAGNOSIS — E876 Hypokalemia: Secondary | ICD-10-CM | POA: Insufficient documentation

## 2019-10-19 DIAGNOSIS — Z331 Pregnant state, incidental: Secondary | ICD-10-CM | POA: Insufficient documentation

## 2019-10-19 LAB — GC/CHLAMYDIA PROBE AMP (~~LOC~~) NOT AT ARMC
Chlamydia: NEGATIVE
Comment: NEGATIVE
Comment: NORMAL
Neisseria Gonorrhea: NEGATIVE

## 2019-10-19 LAB — CULTURE, OB URINE

## 2019-10-19 LAB — HEPATITIS C ANTIBODY (REFLEX): HCV Ab: <0.1 s/co ratio (ref 0.0–0.9)

## 2019-10-19 LAB — HCV COMMENT:

## 2019-10-19 LAB — HEPATITIS B SURFACE ANTIBODY, QUANTITATIVE: Hep B S AB Quant (Post): 277.9 m[IU]/mL (ref >9.9)

## 2020-09-11 IMAGING — US US OB < 14 WEEKS - US OB TV
1 series · 15 of 28 positions shown · non-contrast
Comparison: None.

CLINICAL DATA: Positive pregnancy test, pelvic pain

EXAM:
OBSTETRIC <14 WK US AND TRANSVAGINAL OB US
TECHNIQUE: Both transabdominal and transvaginal ultrasound examinations were
performed for complete evaluation of the gestation as well as the
maternal uterus, adnexal regions, and pelvic cul-de-sac.
Transvaginal technique was performed to assess early pregnancy.

[Series 1: us ob < 14 weeks - us ob tv · 15 of 32 slices shown]
[im 1/32]
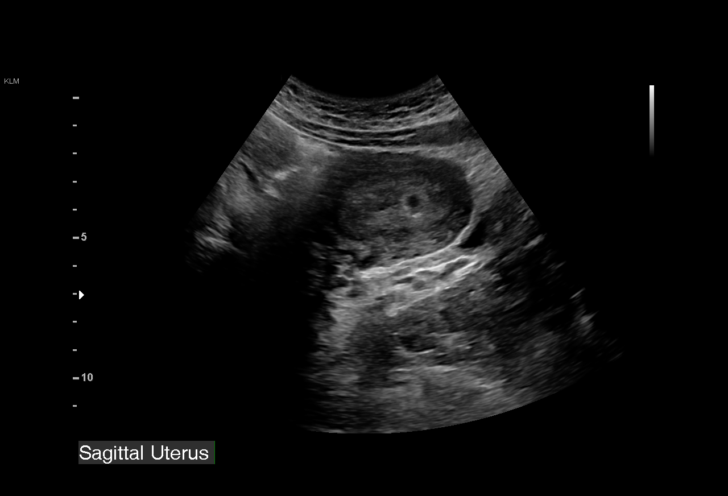
[im 3/32]
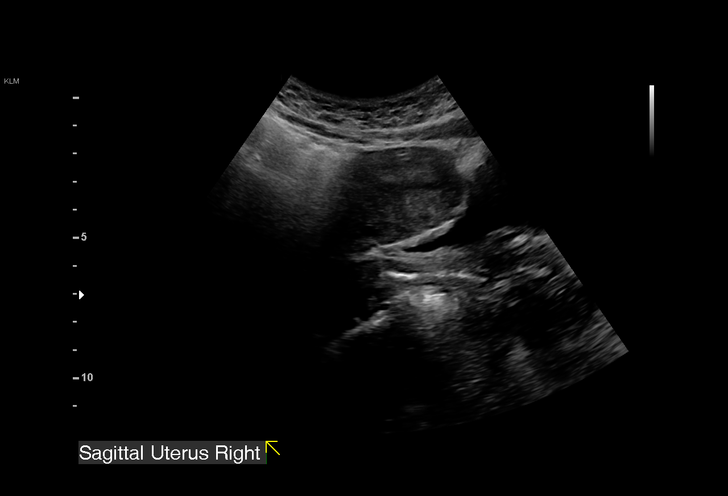
[im 5/32]
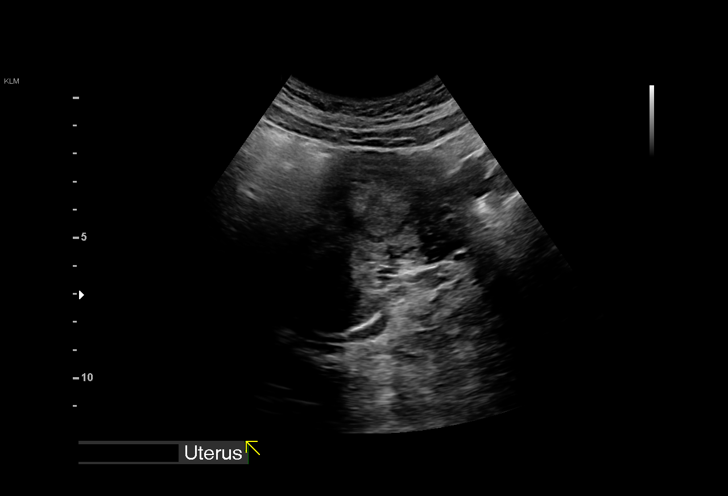
[im 7/32]
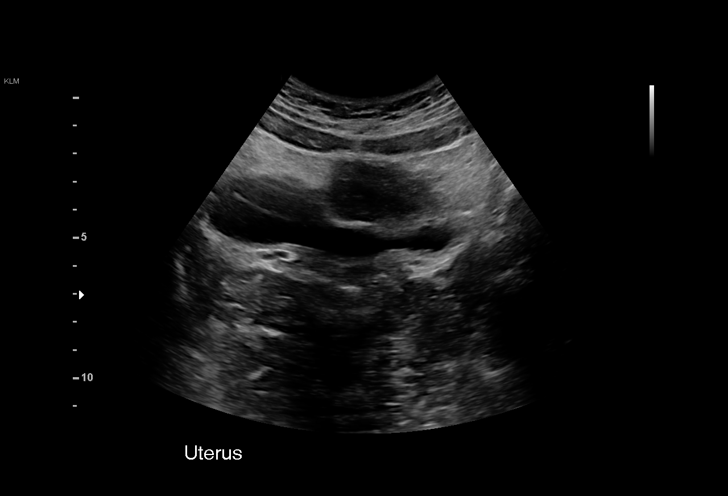
[im 10/32]
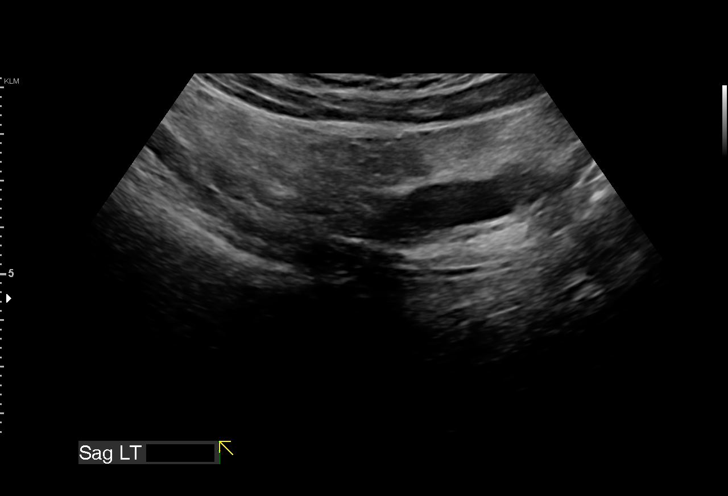
[im 12/32]
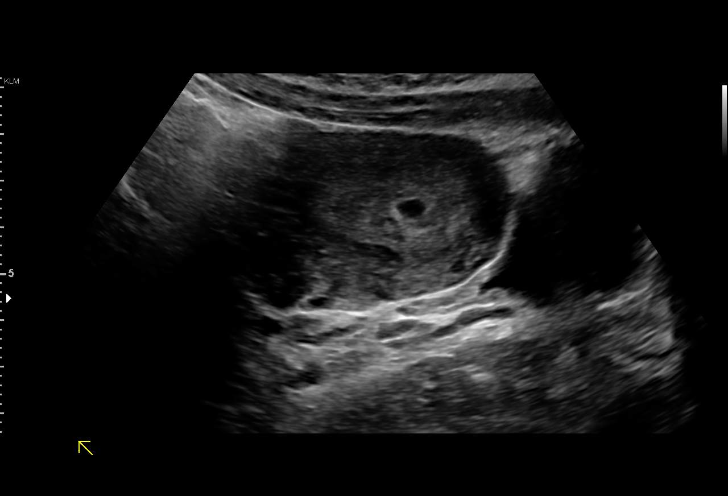
[im 14/32]
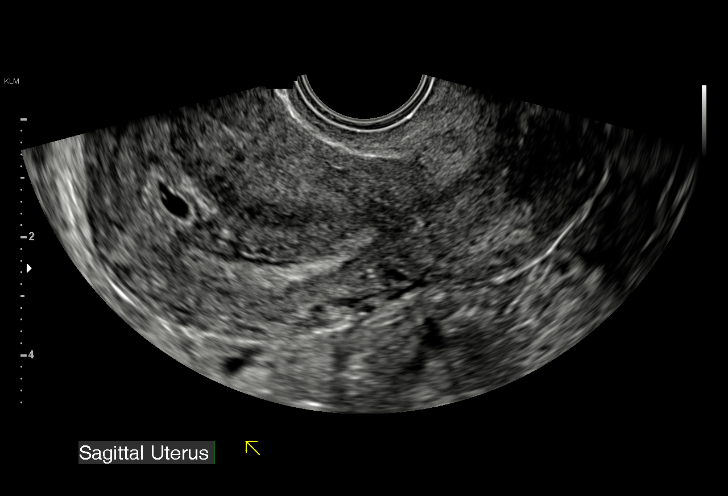
[im 17/32]
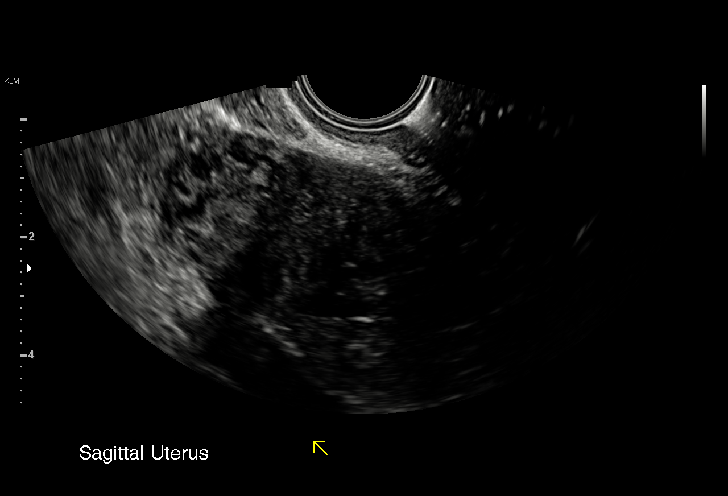
[im 18/32]
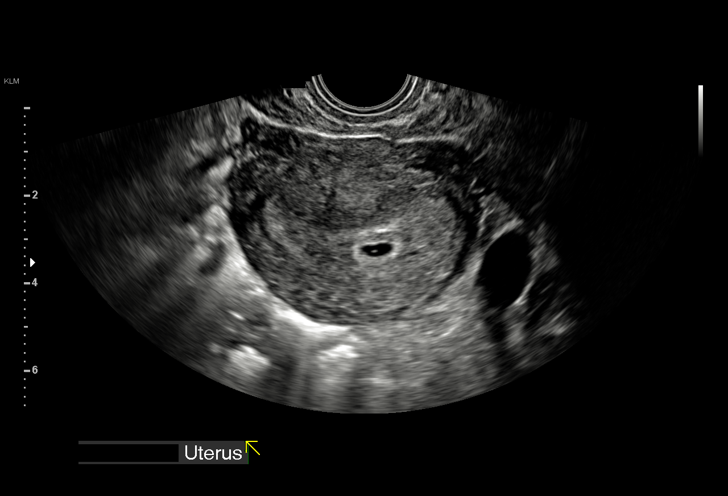
[im 20/32]
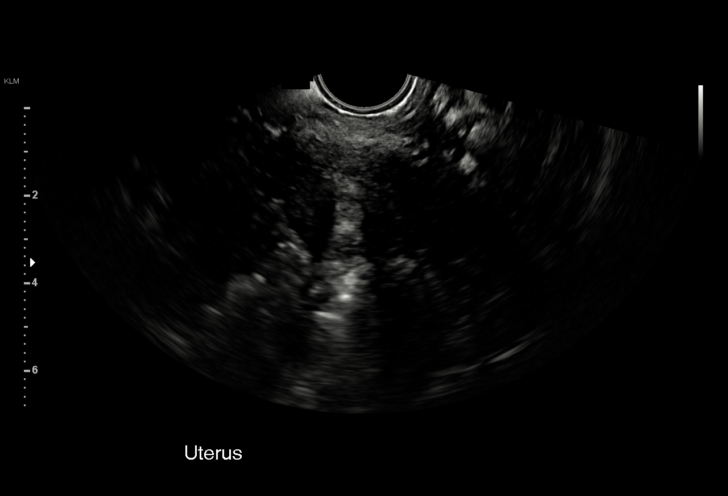
[im 22/32]
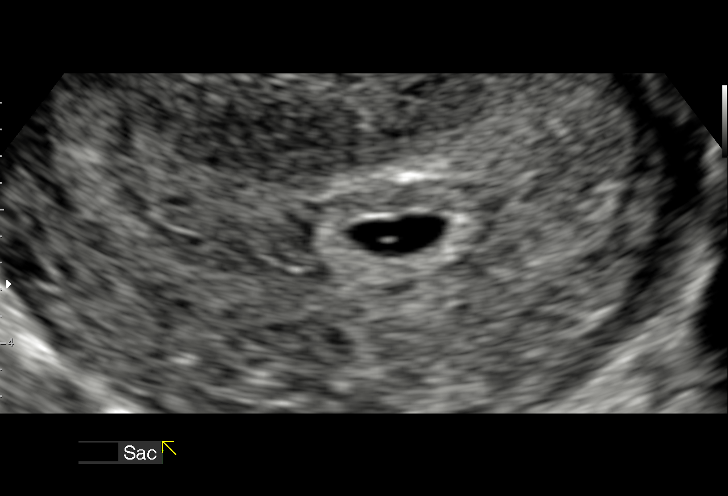
[im 25/32]
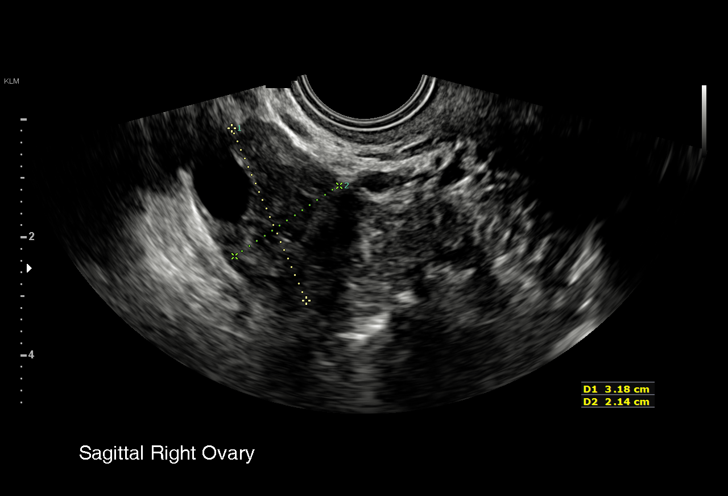
[im 27/32]
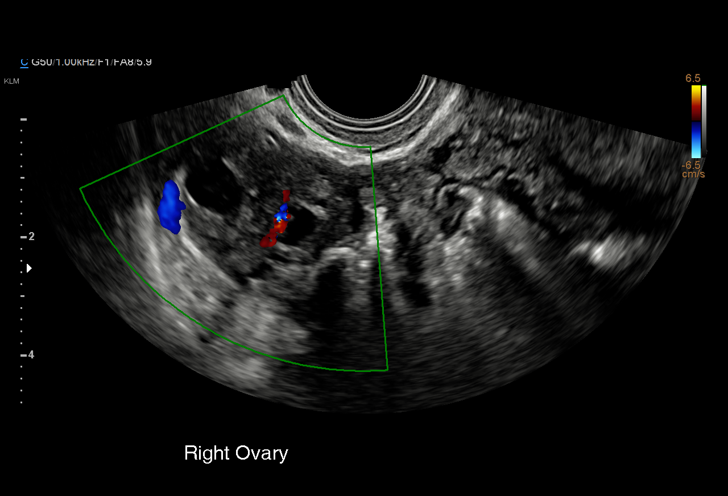
[im 29/32]
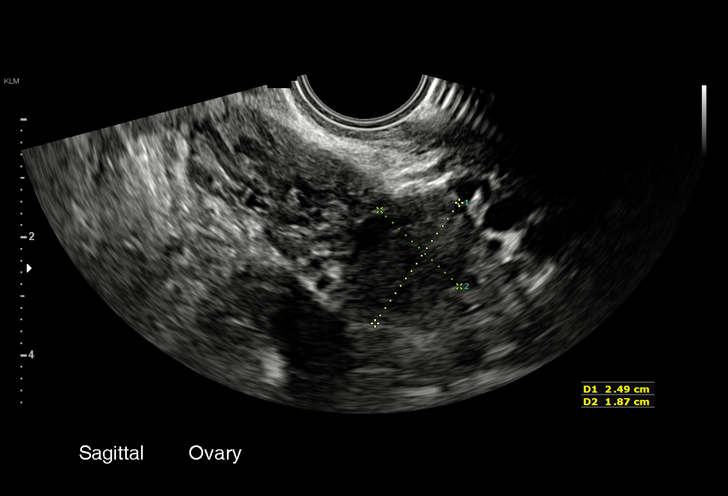
[im 32/32]
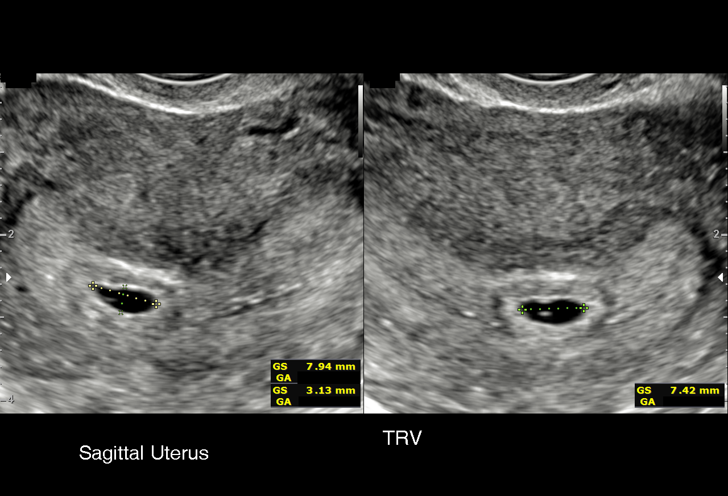

[15 of 28 positions shown; findings below may reference images not displayed]

FINDINGS: Intrauterine gestational sac: Single

Yolk sac:  Visualized.

Embryo:  Not Visualized.

Cardiac Activity: Not Visualized.

MSD: 6.16 mm   5 w   2 d

Subchorionic hemorrhage:  None visualized.

Maternal uterus/adnexae: Normal appearing ovaries.
IMPRESSION: Probable early intrauterine gestational sac and yolk sac, but no
fetal pole, or cardiac activity yet visualized. Recommend follow-up
quantitative B-HCG levels and follow-up US in 14 days to assess
viability. This recommendation follows SRU consensus guidelines:
Diagnostic Criteria for Nonviable Pregnancy Early in the First
Trimester. N Engl J Med 5550; [DATE].

## 2020-09-11 IMAGING — US US ABDOMEN COMPLETE
1 series · 15 of 25 positions shown · non-contrast
Comparison: None.

CLINICAL DATA: Pelvic pain and left upper quadrant pain

EXAM:
ABDOMEN ULTRASOUND COMPLETE

[Series 1: us abdomen complete · 15 of 59 slices shown]
[im 1/59]
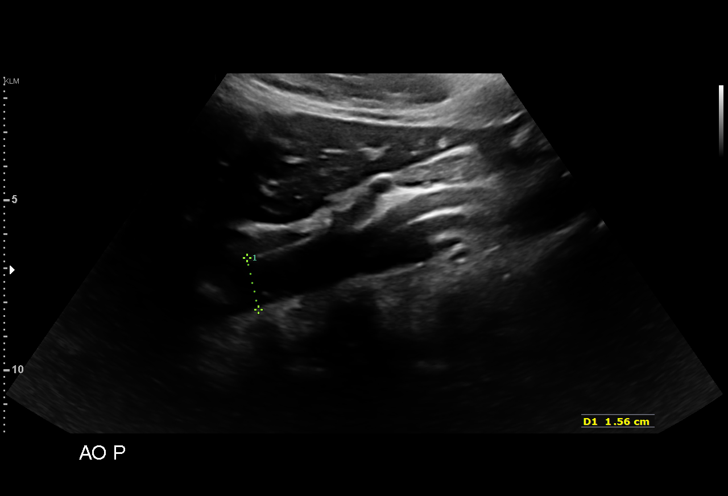
[im 5/59]
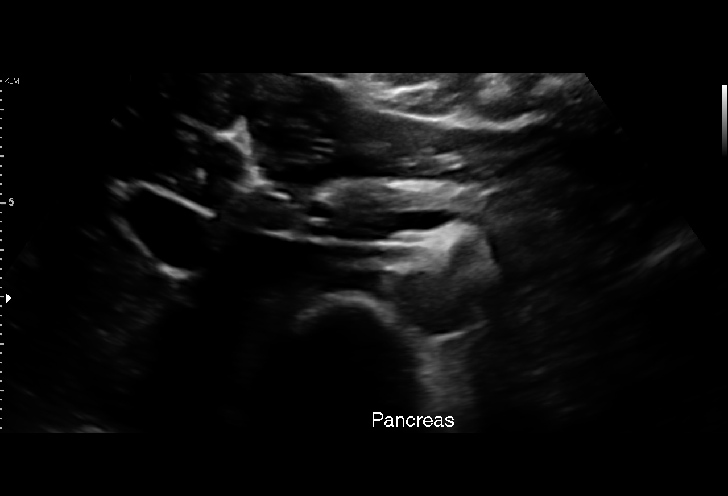
[im 10/59]
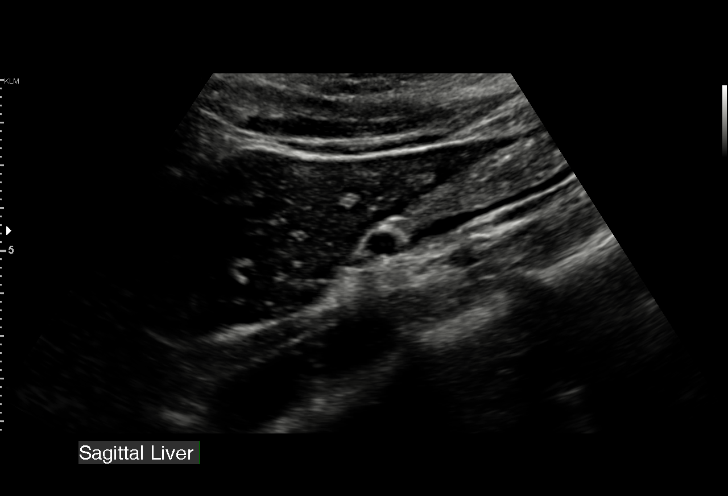
[im 13/59]
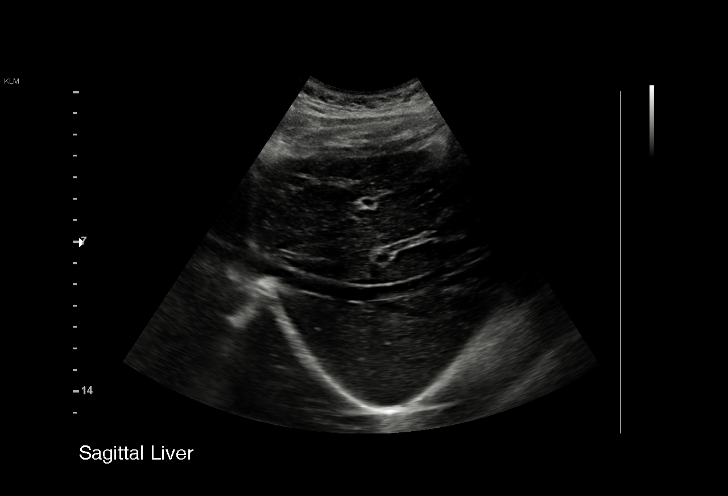
[im 17/59]
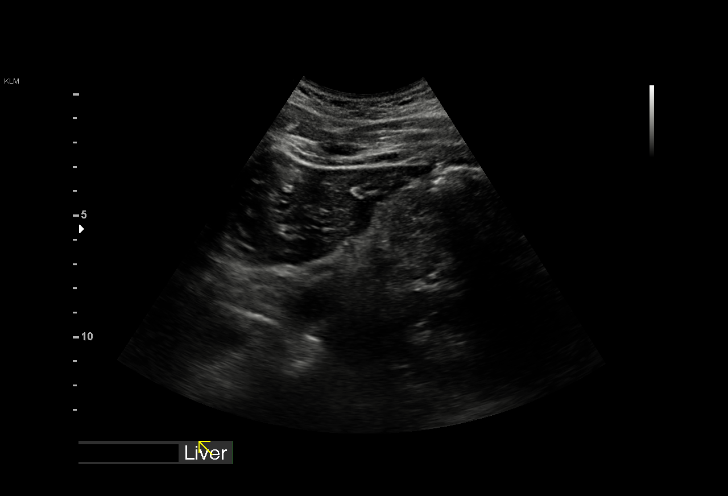
[im 22/59]
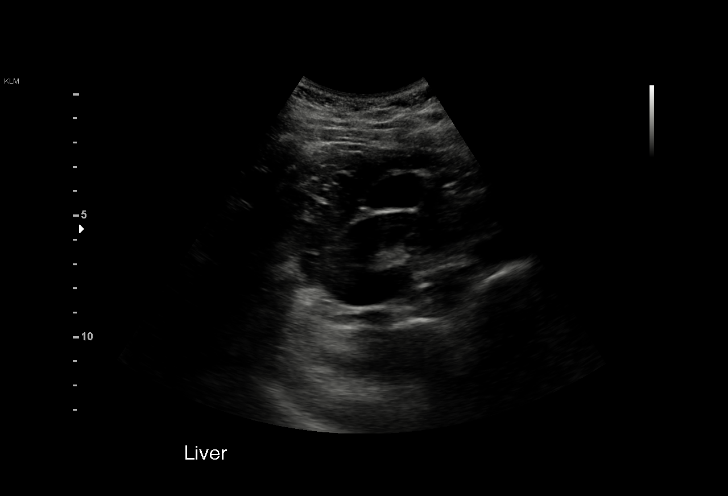
[im 25/59]
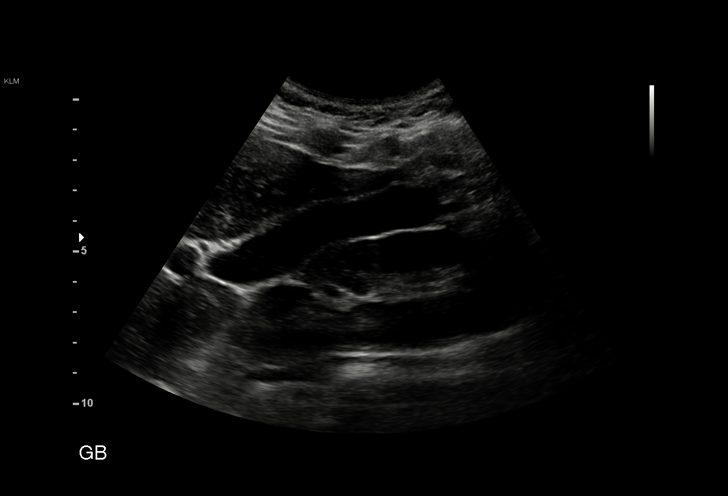
[im 30/59]
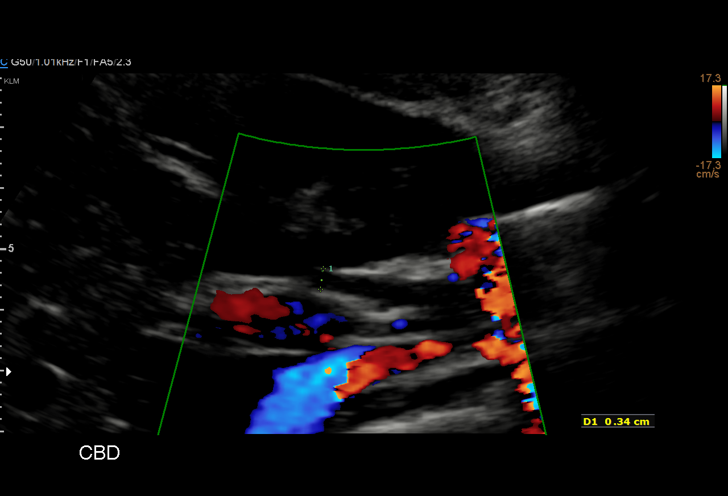
[im 34/59]
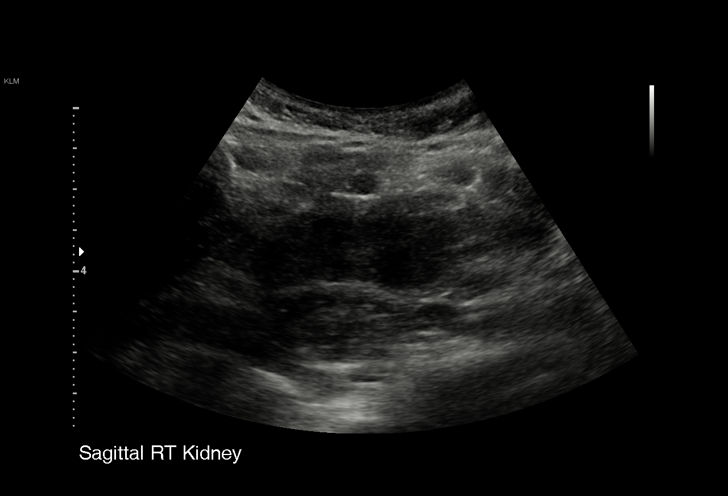
[im 37/59]
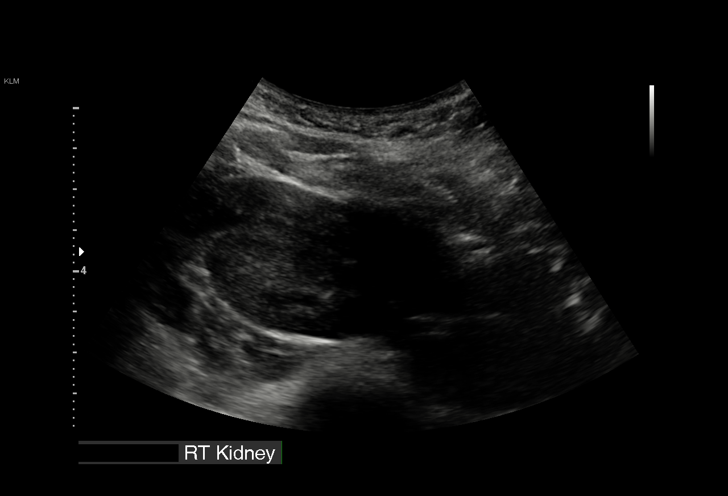
[im 42/59]
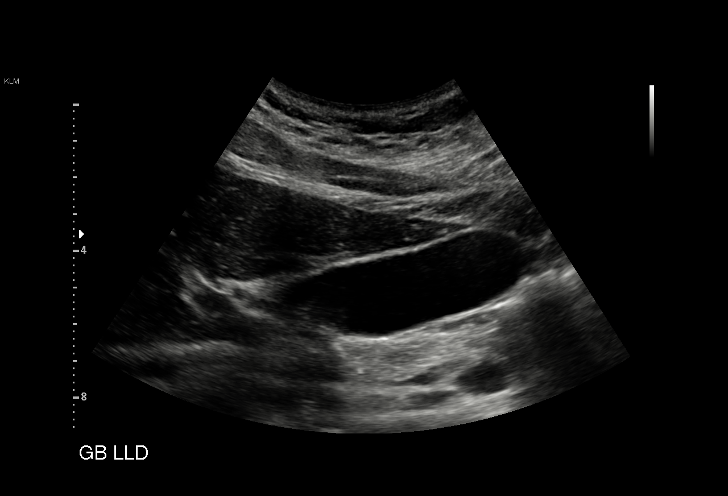
[im 46/59]
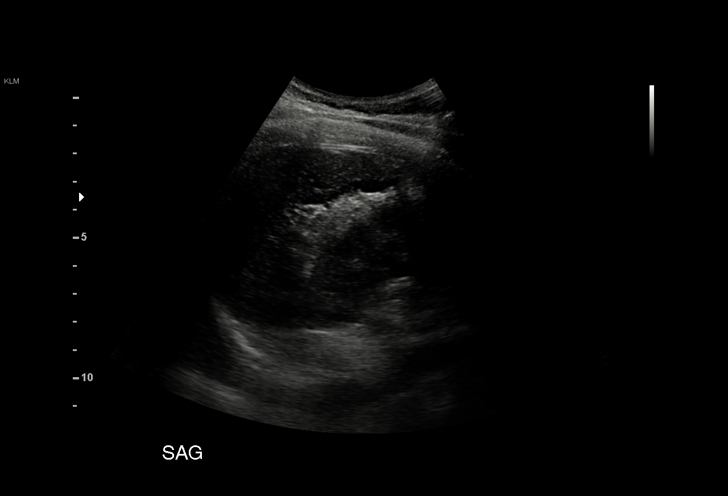
[im 49/59]
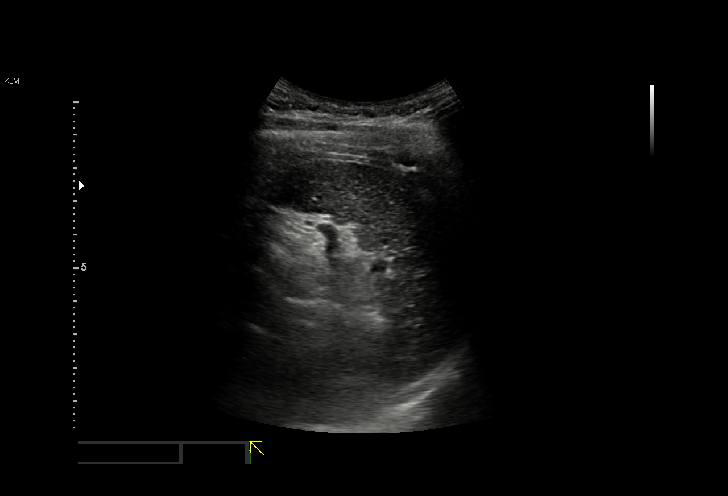
[im 54/59]
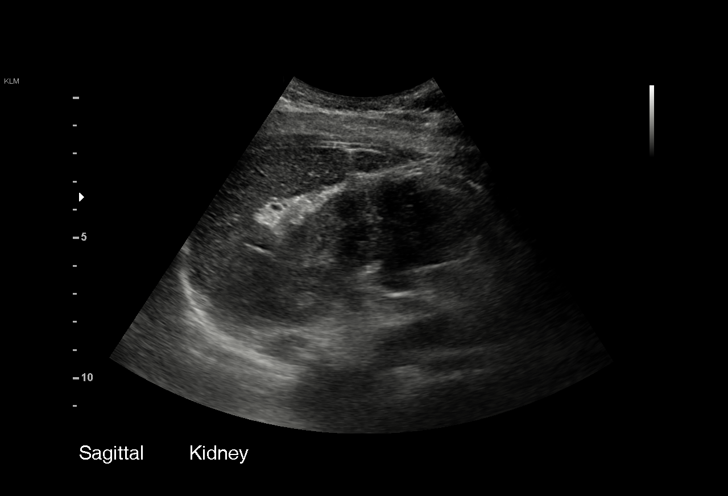
[im 59/59]
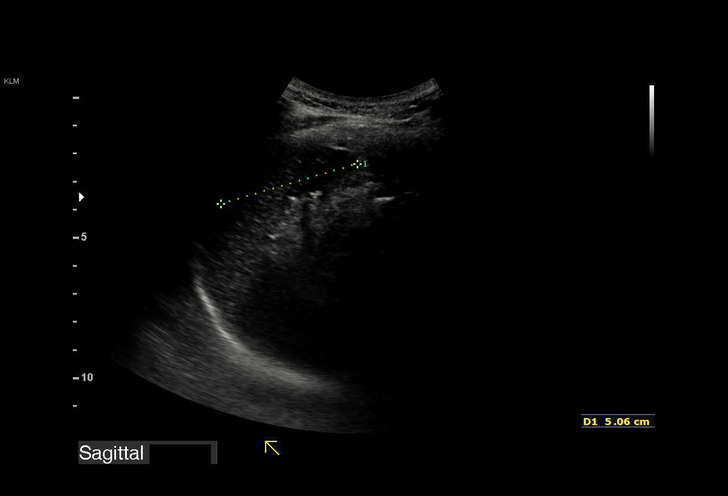

[15 of 25 positions shown; findings below may reference images not displayed]

FINDINGS: Gallbladder: No gallstones or wall thickening visualized. No
sonographic Murphy sign noted by sonographer.

Common bile duct: Diameter: 3.6 mm

Liver: No focal lesion identified. Within normal limits in
parenchymal echogenicity. Portal vein is patent on color Doppler
imaging with normal direction of blood flow towards the liver.

IVC: No abnormality visualized.

Pancreas: Visualized portion unremarkable.

Spleen: Size and appearance within normal limits.

Right Kidney: Length: 9.7. Echogenicity within normal limits. No
mass or hydronephrosis visualized.

Left Kidney: Length: 10.4. Echogenicity within normal limits. No
mass or hydronephrosis visualized.

Abdominal aorta: No aneurysm visualized.

Other findings: None.
IMPRESSION: Normal abdominal ultrasound
